# Patient Record
Sex: Female | Born: 1970 | Race: White | Hispanic: No | Marital: Single | Smoking: Current every day smoker
Health system: Southern US, Community
[De-identification: ages and names within clinical notes are randomized; demographics above are authoritative.]

## PROBLEM LIST (undated history)

## (undated) DIAGNOSIS — F3181 Bipolar II disorder: Secondary | ICD-10-CM

## (undated) DIAGNOSIS — R011 Cardiac murmur, unspecified: Secondary | ICD-10-CM

## (undated) DIAGNOSIS — D649 Anemia, unspecified: Secondary | ICD-10-CM

## (undated) DIAGNOSIS — F209 Schizophrenia, unspecified: Secondary | ICD-10-CM

## (undated) DIAGNOSIS — J45909 Unspecified asthma, uncomplicated: Secondary | ICD-10-CM

---

## 2013-11-03 ENCOUNTER — Encounter (HOSPITAL_COMMUNITY): Payer: Self-pay | Admitting: Emergency Medicine

## 2013-11-03 ENCOUNTER — Emergency Department (HOSPITAL_COMMUNITY): Payer: Medicaid Other

## 2013-11-03 ENCOUNTER — Emergency Department (HOSPITAL_COMMUNITY)
Admission: EM | Admit: 2013-11-03 | Discharge: 2013-11-03 | Disposition: A | Discharge status: Home / Self Care | Payer: Medicaid Other | Attending: Emergency Medicine | Admitting: Emergency Medicine

## 2013-11-03 DIAGNOSIS — Z862 Personal history of diseases of the blood and blood-forming organs and certain disorders involving the immune mechanism: Secondary | ICD-10-CM | POA: Insufficient documentation

## 2013-11-03 DIAGNOSIS — Y92410 Unspecified street and highway as the place of occurrence of the external cause: Secondary | ICD-10-CM | POA: Insufficient documentation

## 2013-11-03 DIAGNOSIS — F17200 Nicotine dependence, unspecified, uncomplicated: Secondary | ICD-10-CM | POA: Insufficient documentation

## 2013-11-03 DIAGNOSIS — Y9389 Activity, other specified: Secondary | ICD-10-CM | POA: Insufficient documentation

## 2013-11-03 DIAGNOSIS — J45909 Unspecified asthma, uncomplicated: Secondary | ICD-10-CM | POA: Insufficient documentation

## 2013-11-03 DIAGNOSIS — S8011XA Contusion of right lower leg, initial encounter: Secondary | ICD-10-CM

## 2013-11-03 DIAGNOSIS — S8010XA Contusion of unspecified lower leg, initial encounter: Secondary | ICD-10-CM | POA: Insufficient documentation

## 2013-11-03 DIAGNOSIS — R011 Cardiac murmur, unspecified: Secondary | ICD-10-CM | POA: Insufficient documentation

## 2013-11-03 DIAGNOSIS — Z8659 Personal history of other mental and behavioral disorders: Secondary | ICD-10-CM | POA: Insufficient documentation

## 2013-11-03 HISTORY — DX: Cardiac murmur, unspecified: R01.1

## 2013-11-03 HISTORY — DX: Anemia, unspecified: D64.9

## 2013-11-03 HISTORY — DX: Schizophrenia, unspecified: F20.9

## 2013-11-03 HISTORY — DX: Bipolar II disorder: F31.81

## 2013-11-03 HISTORY — DX: Unspecified asthma, uncomplicated: J45.909

## 2013-11-03 MED ORDER — OXYCODONE-ACETAMINOPHEN 5-325 MG PO TABS
1.0000 | ORAL_TABLET | Freq: Once | ORAL | Status: AC
  Administered 2013-11-03: 1 via ORAL
  Filled 2013-11-03: qty 1

## 2013-11-03 MED ORDER — HYDROCODONE-ACETAMINOPHEN 5-325 MG PO TABS: 1.0000 | ORAL_TABLET | Freq: Four times a day (QID) | ORAL | Status: AC | PRN
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
## 2013-11-03 NOTE — ED Provider Notes (Signed)
Medical screening examination/treatment/procedure(s) were performed by non-physician practitioner and as supervising physician I was immediately available for consultation/collaboration.  EKG Interpretation   None         Myrel Rappleye, MD 11/03/13 2341 

## 2013-11-03 NOTE — ED Notes (Signed)
Pt used ice pack, took ACET and IBU with no relief.
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
